# Patient Record
Sex: Female | Born: 1975 | Race: Black or African American | Hispanic: No | Smoking: Never smoker
Health system: Southern US, Community
[De-identification: ages and names within clinical notes are randomized; demographics above are authoritative.]

## PROBLEM LIST (undated history)

## (undated) ENCOUNTER — Emergency Department (HOSPITAL_COMMUNITY): Admission: EM | Payer: Self-pay

---

## 2014-08-03 ENCOUNTER — Emergency Department: Payer: Self-pay | Admitting: Emergency Medicine

## 2016-01-08 IMAGING — CR DG ANKLE COMPLETE 3+V*L*
1 series · 3 of 3 positions shown · non-contrast
Comparison: None.

CLINICAL DATA: Patient fell down 4 steps around 11 p.m. last night.
Medial left ankle pain.

EXAM:
LEFT ANKLE COMPLETE - 3+ VIEW

[Series 1: x ankle ap left · 0.14mm/px · 3 of 3 slices shown]
[im 1/3]
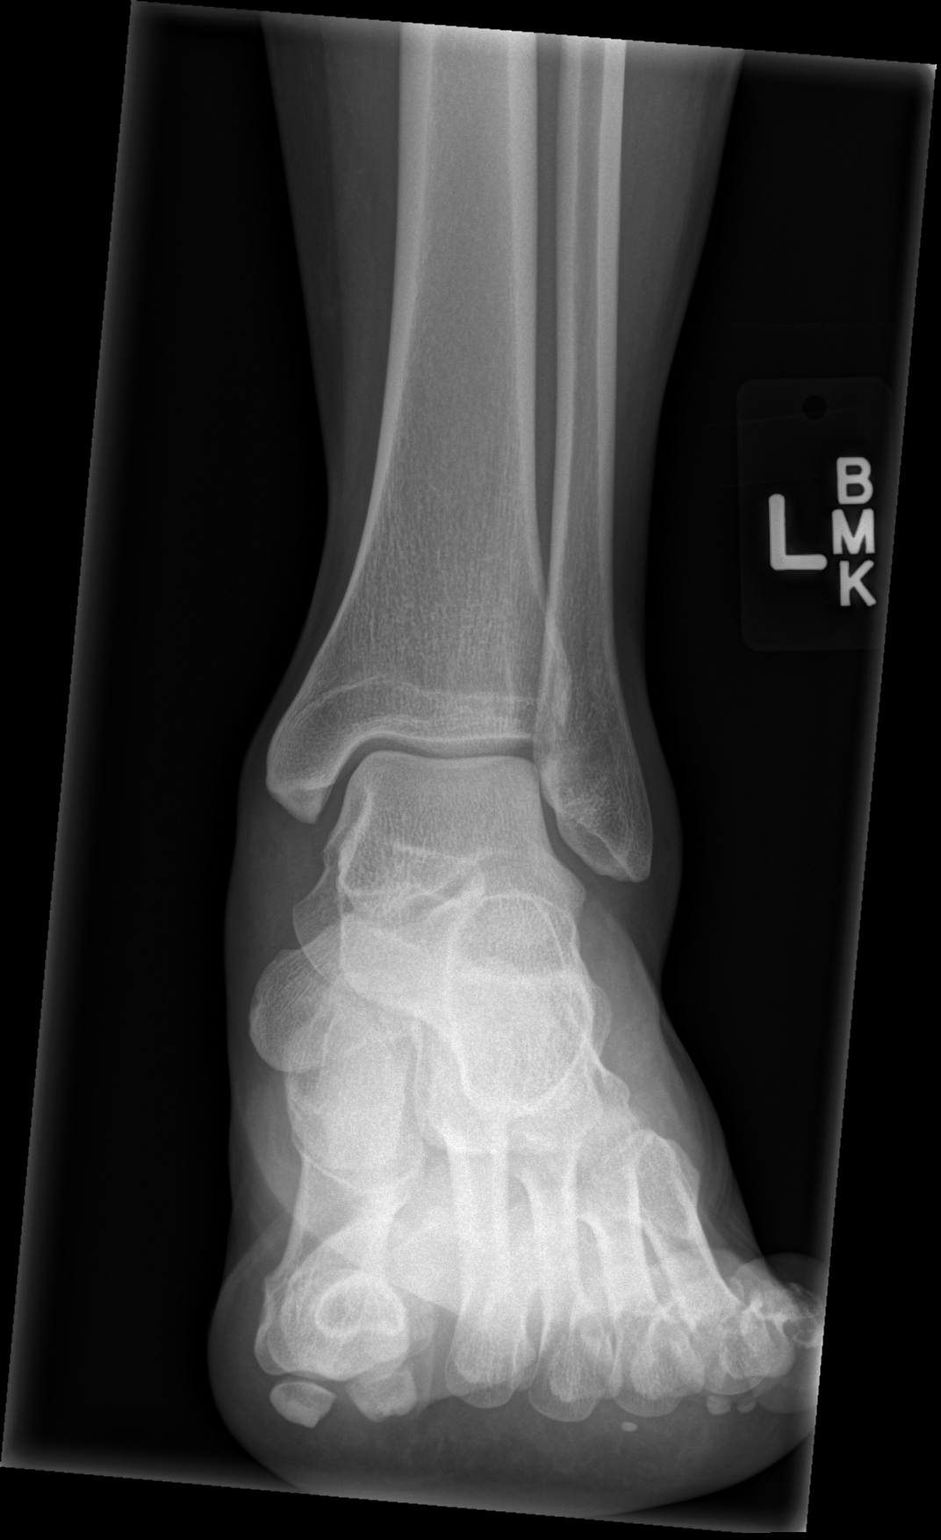
[im 2/3]
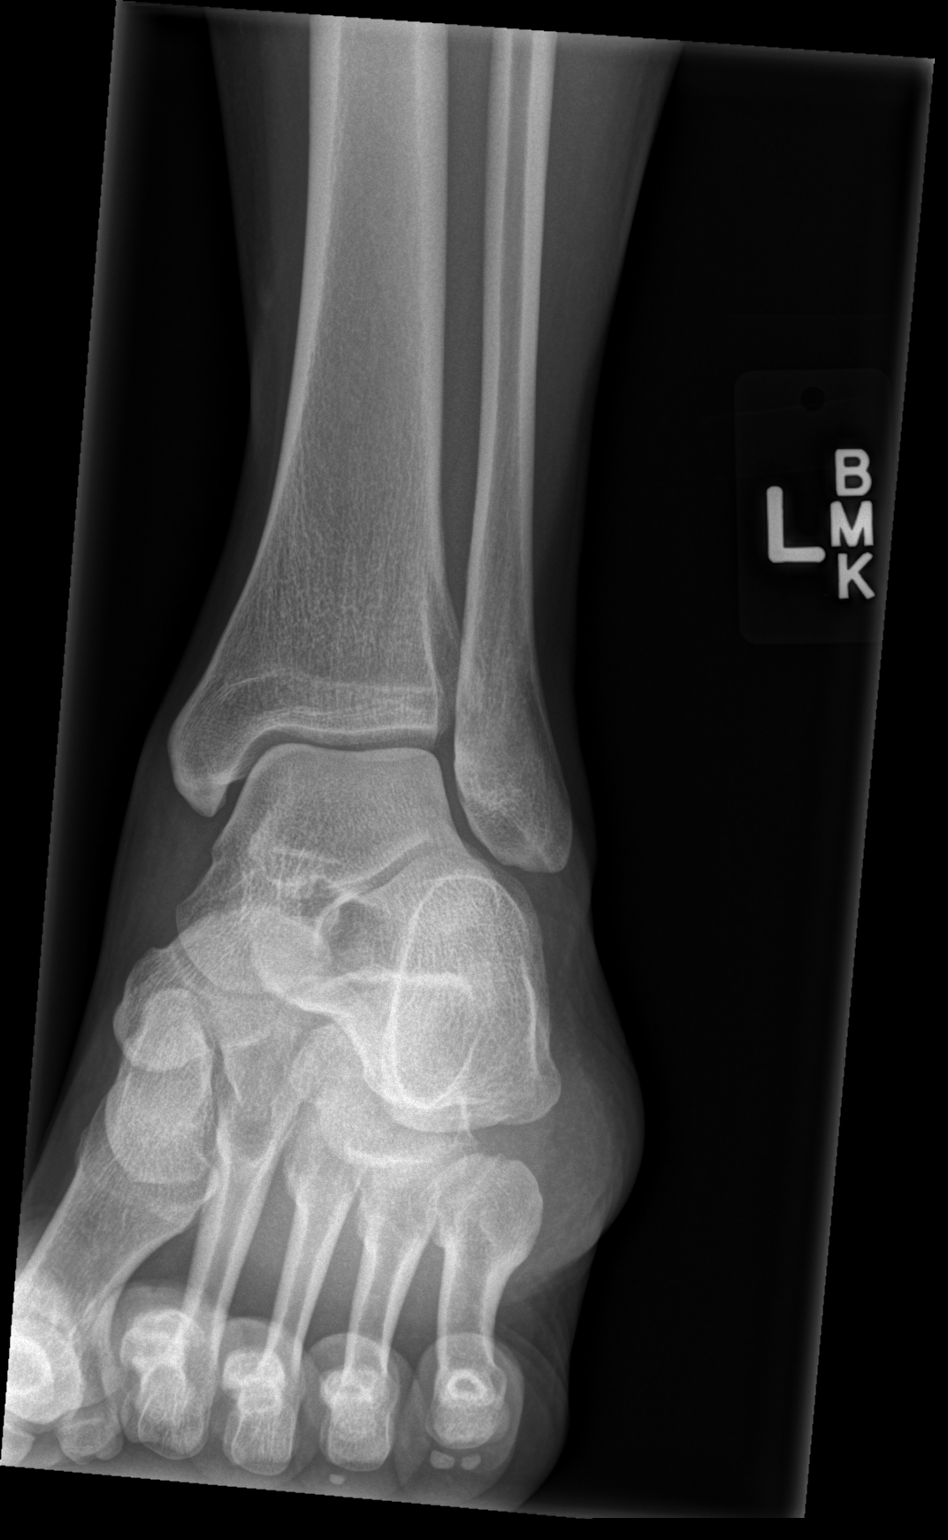
[im 3/3]
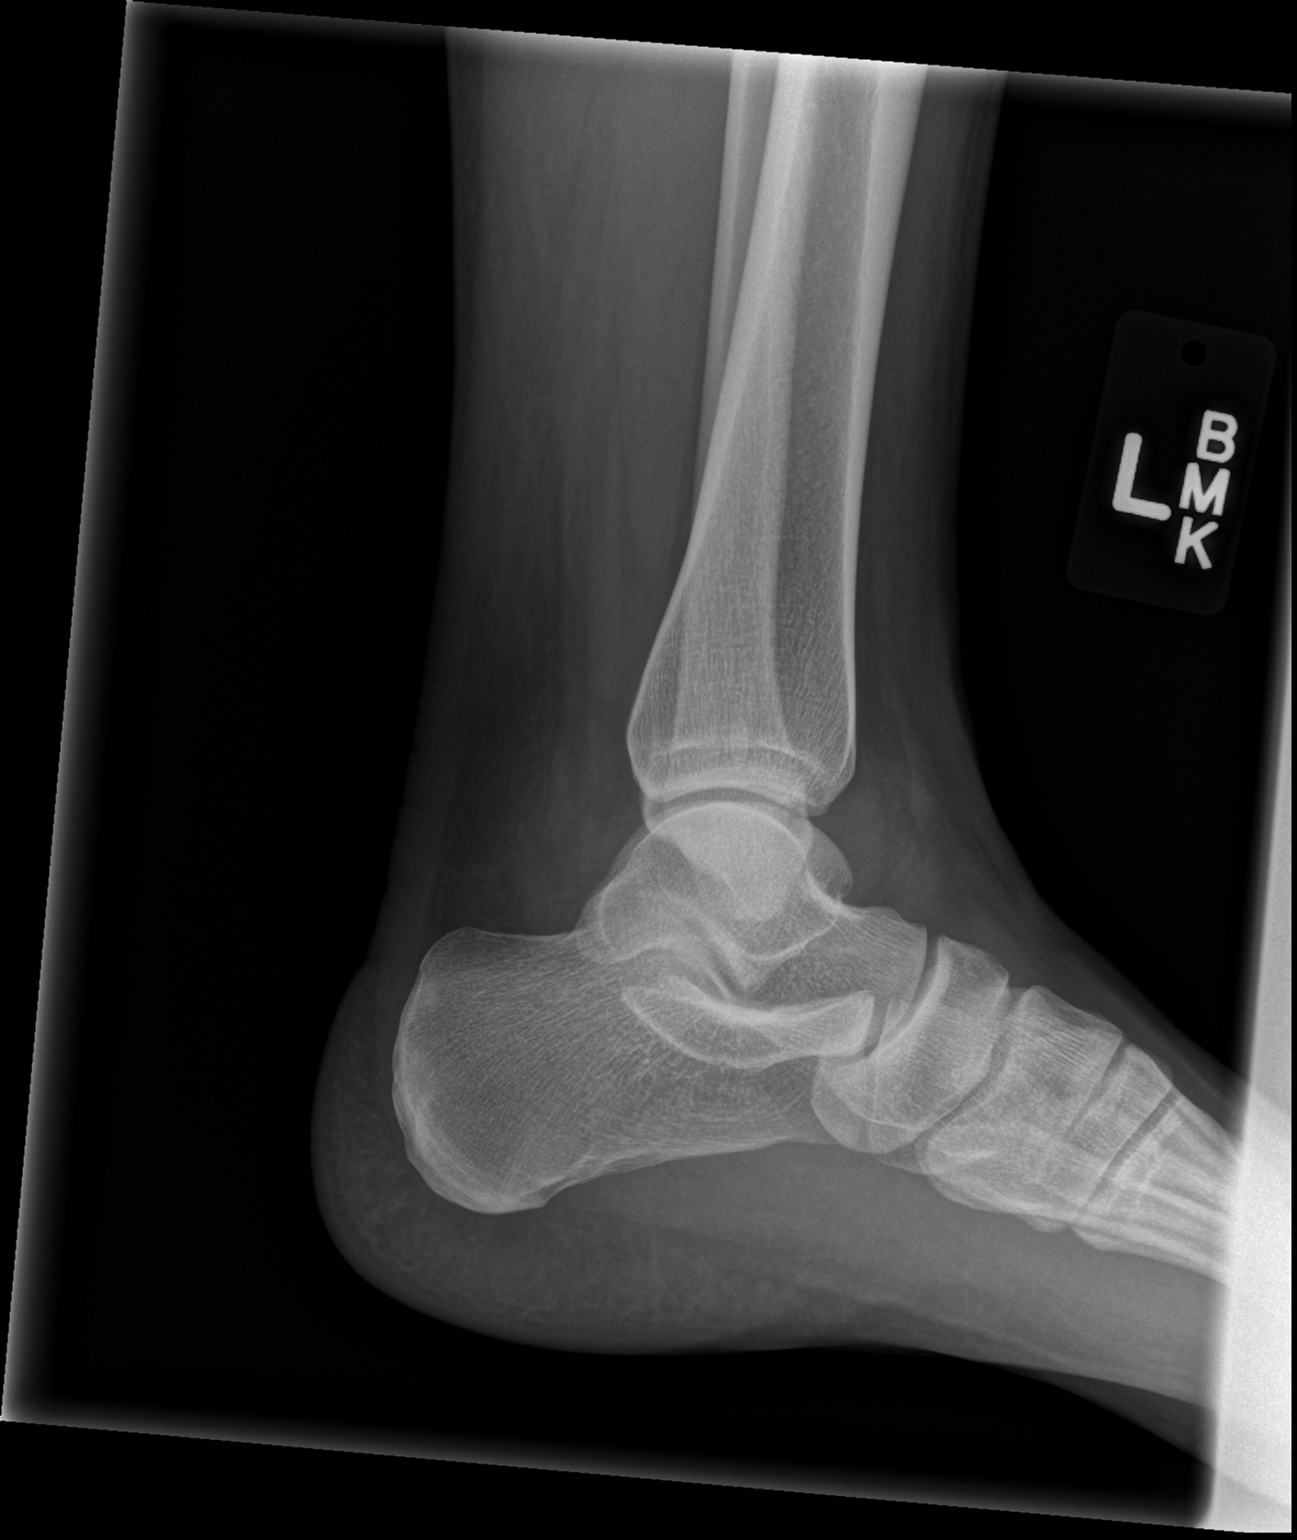

[3 of 3 positions shown; findings below may reference images not displayed]

FINDINGS: There is no evidence of fracture, dislocation, or joint effusion.
There is no evidence of arthropathy or other focal bone abnormality.
Soft tissues are unremarkable.
IMPRESSION: Negative.

## 2019-07-09 ENCOUNTER — Other Ambulatory Visit: Payer: Self-pay

## 2019-07-09 DIAGNOSIS — Z20822 Contact with and (suspected) exposure to covid-19: Secondary | ICD-10-CM

## 2019-07-10 LAB — NOVEL CORONAVIRUS, NAA: SARS-CoV-2, NAA: NOT DETECTED

## 2019-12-31 ENCOUNTER — Ambulatory Visit: Payer: Self-pay | Attending: Internal Medicine

## 2019-12-31 ENCOUNTER — Other Ambulatory Visit: Payer: Self-pay

## 2019-12-31 DIAGNOSIS — Z23 Encounter for immunization: Secondary | ICD-10-CM

## 2019-12-31 NOTE — Progress Notes (Signed)
   Covid-19 Vaccination Clinic  Name:  Sara Hughes    MRN: [id number] DOB: 01-21-1976  12/31/2019  [name] was observed post Covid-19 immunization for 15 minutes without incident. She was provided with Vaccine Information Sheet and instruction to access the V-Safe system.   [name] was instructed to call 911 with any severe reactions post vaccine: Marland Kitchen Difficulty breathing  . Swelling of face and throat  . A fast heartbeat  . A bad rash all over body  . Dizziness and weakness   Immunizations Administered    Name Date Dose VIS Date Route   Pfizer COVID-19 Vaccine 12/31/2019 12:27 PM 0.3 mL 09/07/2019 Intramuscular   Manufacturer: [employer], [employer]   Lot: [id number]   NDC: [address]-1

## 2020-01-23 ENCOUNTER — Ambulatory Visit: Payer: Self-pay

## 2020-01-24 ENCOUNTER — Ambulatory Visit: Payer: Self-pay | Attending: Internal Medicine

## 2020-01-24 DIAGNOSIS — Z23 Encounter for immunization: Secondary | ICD-10-CM

## 2020-01-24 NOTE — Progress Notes (Signed)
   Covid-19 Vaccination Clinic  Name:  Rayvin Abid    MRN: [id number] DOB: 04-10-1976  01/24/2020  Ms. Pruett was observed post Covid-19 immunization for 15 minutes without incident. She was provided with Vaccine Information Sheet and instruction to access the V-Safe system.   Ms. Sidman was instructed to call 911 with any severe reactions post vaccine: Marland Kitchen Difficulty breathing  . Swelling of face and throat  . A fast heartbeat  . A bad rash all over body  . Dizziness and weakness   Immunizations Administered    Name Date Dose VIS Date Route   Pfizer COVID-19 Vaccine 01/24/2020  9:37 AM 0.3 mL 11/21/2018 Intramuscular   Manufacturer: [employer], [employer]   Lot: [id number]   NDC: [address]-2

## 2020-05-27 ENCOUNTER — Ambulatory Visit (LOCAL_COMMUNITY_HEALTH_CENTER): Payer: Self-pay

## 2020-05-27 ENCOUNTER — Other Ambulatory Visit: Payer: Self-pay

## 2020-05-27 DIAGNOSIS — Z111 Encounter for screening for respiratory tuberculosis: Secondary | ICD-10-CM

## 2020-05-30 ENCOUNTER — Ambulatory Visit (LOCAL_COMMUNITY_HEALTH_CENTER): Payer: Self-pay

## 2020-05-30 ENCOUNTER — Other Ambulatory Visit: Payer: Self-pay

## 2020-05-30 DIAGNOSIS — Z111 Encounter for screening for respiratory tuberculosis: Secondary | ICD-10-CM

## 2020-05-30 LAB — TB SKIN TEST
Induration: 0 mm
TB Skin Test: NEGATIVE

## 2021-07-31 ENCOUNTER — Ambulatory Visit: Payer: BC Managed Care – PPO

## 2021-07-31 DIAGNOSIS — Z23 Encounter for immunization: Secondary | ICD-10-CM | POA: Diagnosis not present

## 2021-08-10 ENCOUNTER — Ambulatory Visit: Payer: BC Managed Care – PPO

## 2023-04-11 ENCOUNTER — Encounter: Payer: Self-pay | Admitting: Emergency Medicine

## 2023-04-11 ENCOUNTER — Other Ambulatory Visit: Payer: Self-pay

## 2023-04-11 ENCOUNTER — Emergency Department
Admission: EM | Admit: 2023-04-11 | Discharge: 2023-04-11 | Disposition: A | Discharge status: Home / Self Care | Payer: BC Managed Care – PPO | Attending: Emergency Medicine | Admitting: Emergency Medicine

## 2023-04-11 ENCOUNTER — Emergency Department: Payer: BC Managed Care – PPO

## 2023-04-11 DIAGNOSIS — R519 Headache, unspecified: Secondary | ICD-10-CM | POA: Diagnosis not present

## 2023-04-11 DIAGNOSIS — R079 Chest pain, unspecified: Secondary | ICD-10-CM

## 2023-04-11 DIAGNOSIS — R072 Precordial pain: Secondary | ICD-10-CM | POA: Diagnosis present

## 2023-04-11 LAB — BASIC METABOLIC PANEL
Anion gap: 8 (ref 5–15)
BUN: 12 mg/dL (ref 6–20)
CO2: 23 mmol/L (ref 22–32)
Calcium: 8.8 mg/dL — ABNORMAL LOW (ref 8.9–10.3)
Chloride: 104 mmol/L (ref 98–111)
Creatinine, Ser: 0.66 mg/dL (ref 0.44–1.00)
GFR, Estimated: >60 mL/min (ref >60)
Glucose, Bld: 97 mg/dL (ref 70–99)
Potassium: 3.4 mmol/L — ABNORMAL LOW (ref 3.5–5.1)
Sodium: 135 mmol/L (ref 135–145)

## 2023-04-11 LAB — CBC
HCT: 39 % (ref 36.0–46.0)
Hemoglobin: 12.5 g/dL (ref 12.0–15.0)
MCH: 27.7 pg (ref 26.0–34.0)
MCHC: 32.1 g/dL (ref 30.0–36.0)
MCV: 86.5 fL (ref 80.0–100.0)
Platelets: 214 10*3/uL (ref 150–400)
RBC: 4.51 MIL/uL (ref 3.87–5.11)
RDW: 13.9 % (ref 11.5–15.5)
WBC: 6.9 10*3/uL (ref 4.0–10.5)
nRBC: 0 % (ref 0.0–0.2)

## 2023-04-11 LAB — TROPONIN I (HIGH SENSITIVITY): Troponin I (High Sensitivity): 3 ng/L (ref <18)

## 2023-04-11 LAB — POC URINE PREG, ED: Preg Test, Ur: NEGATIVE

## 2023-04-11 NOTE — Discharge Instructions (Addendum)
Your evaluation in the emergency department did not show any signs of dangerous conditions like heart attack or stroke.  It is important to follow-up with a primary doctor-I made a referral to a primary doctor who will reach out to you to schedule an appointment to establish care.  For your migraine headaches, call Dr. Theora Master who is a neurologist to follow-up with an outpatient visit.  Take acetaminophen 650 mg and ibuprofen 400 mg every 6 hours for headache.  Take with food.   Your blood pressure was elevated in the emergency department today.  As we discussed, check your blood pressure once a day either in the morning or at night when you are calm and rested and record your daily blood pressures to be able to talk to your primary doctor about hypertension and starting medications as needed.  Thank you for choosing [place] for your health care today!  Please see your primary doctor this week for a follow up appointment.   If you have any new, worsening, or unexpected symptoms call your doctor right away or come back to the emergency department for reevaluation.  It was my pleasure to care for you today.   Daneil Dan Modesto Charon, MD

## 2023-04-11 NOTE — ED Triage Notes (Signed)
Pt also requesting CT of head. C/o frontal headaches x few weeks. Head associated with vision changes. Sts this is usual for her. Sts they're triggered when eatings certain foods. Denies headache at this time.

## 2023-04-11 NOTE — ED Provider Notes (Signed)
[facility] Provider Note    Event Date/Time   First MD Initiated Contact with Patient 04/11/23 2226     (approximate)   History   Chest Pain   HPI  Sara Hughes is a 47 y.o. female   Past medical history of migraine headaches, who presents to the emergency department with chest pain.  She says that her chest pain is substernal nonradiating and intermittent lasting seconds at a time over the last 2 months.  Nonexertional.  She has had some dyspnea associated.  She has had no respiratory infectious symptoms.  She denies history of blood clots, hormone use, immobilization.  No unilateral leg symptoms.  She is here in the emergency department because the episode of substernal chest pain is lasting longer today.  Regarding her migraine headaches she said that she has had these longstanding and used to see a neurologist but no longer sees, triggered by certain smells or foods.  She is a mild headache now and is declining medications.  Independent Historian contributed to assessment above: She is here with her husband who corroborates information given above      Physical Exam   Triage Vital Signs: ED Triage Vitals  Encounter Vitals Group     BP 04/11/23 2031 (!) 177/99     Systolic BP Percentile --      Diastolic BP Percentile --      Pulse Rate 04/11/23 2031 80     Resp 04/11/23 2031 17     Temp 04/11/23 2031 98.8 F (37.1 C)     Temp Source 04/11/23 2031 Oral     SpO2 04/11/23 2031 100 %     Weight 04/11/23 2032 166 lb (75.3 kg)     Height 04/11/23 2032 5\' 2"  (1.575 m)     Head Circumference --      Peak Flow --      Pain Score 04/11/23 2031 8     Pain Loc --      Pain Education --      Exclude from Growth Chart --     Most recent vital signs: Vitals:   04/11/23 2031  BP: (!) 177/99  Pulse: 80  Resp: 17  Temp: 98.8 F (37.1 C)  SpO2: 100%    General: Awake, no distress.  CV:  Good peripheral perfusion.  Resp:  Normal effort.   Abd:  No distention.  Other:  Comfortable appearing, hypertensive otherwise vital signs normal.  No respiratory distress clear lungs without focality or wheezing.  Soft nontender abdomen.  Radial pulses intact equal bilaterally.  Appears euvolemic no unilateral leg swelling or pain.  No signs of head trauma, neck is supple with full range of motion.  She has no focal neurologic deficits including dysarthria, facial asymmetry, gait is normal, no motor or sensory deficits.   ED Results / Procedures / Treatments   Labs (all labs ordered are listed, but only abnormal results are displayed) Labs Reviewed  BASIC METABOLIC PANEL - Abnormal; Notable for the following components:      Result Value   Potassium 3.4 (*)    Calcium 8.8 (*)    All other components within normal limits  CBC  URINALYSIS, ROUTINE W REFLEX MICROSCOPIC  POC URINE PREG, ED  TROPONIN I (HIGH SENSITIVITY)  TROPONIN I (HIGH SENSITIVITY)     I ordered and reviewed the above labs they are notable for troponin is negative.  EKG  ED ECG REPORT I, Pilar Jarvis, the attending physician,  personally viewed and interpreted this ECG.   Date: 04/11/2023  EKG Time: 2037  Rate: 70  Rhythm: nsr  Axis: nl  Intervals:none  ST&T Change: no stemi    RADIOLOGY I independently reviewed and interpreted chest x-ray see no obvious focalities or pneumothorax   PROCEDURES:  Critical Care performed: No  Procedures   MEDICATIONS ORDERED IN ED: Medications - No data to display   IMPRESSION / MDM / ASSESSMENT AND PLAN / ED COURSE  I reviewed the triage vital signs and the nursing notes.                                Patient's presentation is most consistent with acute presentation with potential threat to life or bodily function.  Differential diagnosis includes, but is not limited to, ACS, dissection, PE, migraine headache, CVA or ICH   The patient is on the cardiac monitor to evaluate for evidence of arrhythmia  and/or significant heart rate changes.  MDM:    Intermittent nonexertional nonradiating chest pain would be very atypical for ACS, dissection, PE -I doubt cardiopulmonary emergency in this young otherwise healthy patient.  ED is nonischemic and her initial troponin is negative, defer second troponin given chronicity of symptoms and low cardiac risk factors, low clinical suspicion for cardiac ischemia.  Considered PE given her associated dyspnea with chest pain but that this is less likely, Wells low risk and PERC negative.  Symptoms not consistent with dissection.  In terms of her headaches, history of migraine headaches and symptoms are mild at this time and she is declining medications.  I doubt ICH or stroke given no history of trauma, and no focal neurologic deficits.  She initially was requesting CT scan " of my whole body" but I explained to her the risks and benefits of exposure to radiation that is unlikely to find actionable findings and my suggestion to defer advanced imaging at this time, and she understands and agrees.  Her blood pressure is high.  I advised that she monitor her blood pressure and keep a log at home and discussed with PMD for management as needed.    I considered hospitalization for admission or observation however given her stability overall well appearance and unremarkable workup as above and low clinical suspicion for neurologic emergencies or cardiopulmonary emergencies, plan will be for outpatient follow-up.  She is new to this area and has not establish care with a primary doctor so I gave her referral to primary doctor.  I also have her follow-up with Dr. Malvin Johns given her history of migraines no longer seeing neurologist and worsening headaches for further evaluation and management.  She understands return the emergency department for any new or worsening symptoms.   --  She gave a urine sample to triage, which was sent to the lab.  She has no urinary symptoms  like dysuria or frequency but instead wants her urine checked for glucose.  She does not need to stay for the result, I ordered a UA and she can follow-up with PMD for results as needed.        FINAL CLINICAL IMPRESSION(S) / ED DIAGNOSES   Final diagnoses:  Nonspecific chest pain  Nonintractable headache, unspecified chronicity pattern, unspecified headache type     Rx / DC Orders   ED Discharge Orders          Ordered    Ambulatory Referral to Primary Care (Establish Care)  04/11/23 2248             Note:  This document was prepared using Dragon voice recognition software and may include unintentional dictation errors.    Pilar Jarvis, MD 04/11/23 2300

## 2023-04-11 NOTE — ED Triage Notes (Signed)
Pt c/o sudden onset of centralized chest pain while at rest around 6:30 pm. Pain persistent. Pain associated with SOB, worsening with ambulation. Denies HX HTN BP 177/99 in triage. Denies cardiac hx.

## 2023-04-11 NOTE — ED Notes (Signed)
Urine specimen cup provided to pt for pending urine sample

## 2023-04-12 LAB — URINALYSIS, ROUTINE W REFLEX MICROSCOPIC
Bacteria, UA: NONE SEEN
Bilirubin Urine: NEGATIVE
Glucose, UA: NEGATIVE mg/dL
Ketones, ur: NEGATIVE mg/dL
Leukocytes,Ua: NEGATIVE
Nitrite: NEGATIVE
Protein, ur: NEGATIVE mg/dL
Specific Gravity, Urine: 1.009 (ref 1.005–1.030)
Squamous Epithelial / HPF: NONE SEEN /HPF (ref 0–5)
pH: 6 (ref 5.0–8.0)

## 2023-05-18 ENCOUNTER — Other Ambulatory Visit: Payer: Self-pay | Admitting: Neurology

## 2023-05-18 DIAGNOSIS — R519 Headache, unspecified: Secondary | ICD-10-CM

## 2023-05-31 ENCOUNTER — Encounter: Payer: Self-pay | Admitting: Neurology

## 2023-06-08 ENCOUNTER — Ambulatory Visit
Admission: RE | Admit: 2023-06-08 | Discharge: 2023-06-08 | Disposition: A | Discharge status: Home / Self Care | Payer: BC Managed Care – PPO | Source: Ambulatory Visit | Attending: Neurology | Admitting: Neurology

## 2023-06-08 DIAGNOSIS — R519 Headache, unspecified: Secondary | ICD-10-CM

## 2023-06-08 MED ORDER — GADOPICLENOL 0.5 MMOL/ML IV SOLN
7.5000 mL | Freq: Once | INTRAVENOUS | Status: AC | PRN
  Administered 2023-06-08: 7.5 mL via INTRAVENOUS

## 2023-06-09 ENCOUNTER — Encounter: Payer: Self-pay | Admitting: Nurse Practitioner

## 2023-06-09 ENCOUNTER — Ambulatory Visit: Payer: BC Managed Care – PPO | Admitting: Nurse Practitioner

## 2023-06-09 VITALS — BP 140/80 | HR 61 | Temp 98.5°F | Ht 62.0 in | Wt 164.4 lb

## 2023-06-09 DIAGNOSIS — Z1329 Encounter for screening for other suspected endocrine disorder: Secondary | ICD-10-CM

## 2023-06-09 DIAGNOSIS — Z1159 Encounter for screening for other viral diseases: Secondary | ICD-10-CM

## 2023-06-09 DIAGNOSIS — Z0000 Encounter for general adult medical examination without abnormal findings: Secondary | ICD-10-CM | POA: Diagnosis not present

## 2023-06-09 DIAGNOSIS — Z114 Encounter for screening for human immunodeficiency virus [HIV]: Secondary | ICD-10-CM

## 2023-06-09 DIAGNOSIS — Z23 Encounter for immunization: Secondary | ICD-10-CM

## 2023-06-09 DIAGNOSIS — Z8632 Personal history of gestational diabetes: Secondary | ICD-10-CM | POA: Diagnosis not present

## 2023-06-09 DIAGNOSIS — Z13220 Encounter for screening for lipoid disorders: Secondary | ICD-10-CM | POA: Diagnosis not present

## 2023-06-09 DIAGNOSIS — R030 Elevated blood-pressure reading, without diagnosis of hypertension: Secondary | ICD-10-CM | POA: Insufficient documentation

## 2023-06-09 DIAGNOSIS — Z1211 Encounter for screening for malignant neoplasm of colon: Secondary | ICD-10-CM

## 2023-06-09 LAB — LIPID PANEL
Cholesterol: 112 mg/dL (ref 0–200)
HDL: 45.3 mg/dL (ref >39.00)
LDL Cholesterol: 54 mg/dL (ref 0–99)
NonHDL: 66.63
Total CHOL/HDL Ratio: 2
Triglycerides: 64 mg/dL (ref 0.0–149.0)
VLDL: 12.8 mg/dL (ref 0.0–40.0)

## 2023-06-09 LAB — COMPREHENSIVE METABOLIC PANEL
ALT: 9 U/L (ref 0–35)
AST: 14 U/L (ref 0–37)
Albumin: 4 g/dL (ref 3.5–5.2)
Alkaline Phosphatase: 53 U/L (ref 39–117)
BUN: 14 mg/dL (ref 6–23)
CO2: 29 meq/L (ref 19–32)
Calcium: 9 mg/dL (ref 8.4–10.5)
Chloride: 105 meq/L (ref 96–112)
Creatinine, Ser: 0.72 mg/dL (ref 0.40–1.20)
GFR: 99.44 mL/min (ref >60.00)
Glucose, Bld: 86 mg/dL (ref 70–99)
Potassium: 3.9 meq/L (ref 3.5–5.1)
Sodium: 141 meq/L (ref 135–145)
Total Bilirubin: 0.8 mg/dL (ref 0.2–1.2)
Total Protein: 6.8 g/dL (ref 6.0–8.3)

## 2023-06-09 LAB — CBC WITH DIFFERENTIAL/PLATELET
Basophils Absolute: 0 10*3/uL (ref 0.0–0.1)
Basophils Relative: 0.7 % (ref 0.0–3.0)
Eosinophils Absolute: 0.1 10*3/uL (ref 0.0–0.7)
Eosinophils Relative: 1.3 % (ref 0.0–5.0)
HCT: 38.2 % (ref 36.0–46.0)
Hemoglobin: 12.3 g/dL (ref 12.0–15.0)
Lymphocytes Relative: 39.3 % (ref 12.0–46.0)
Lymphs Abs: 2 10*3/uL (ref 0.7–4.0)
MCHC: 32.2 g/dL (ref 30.0–36.0)
MCV: 85.3 fl (ref 78.0–100.0)
Monocytes Absolute: 0.2 10*3/uL (ref 0.1–1.0)
Monocytes Relative: 4.9 % (ref 3.0–12.0)
Neutro Abs: 2.7 10*3/uL (ref 1.4–7.7)
Neutrophils Relative %: 53.8 % (ref 43.0–77.0)
Platelets: 244 10*3/uL (ref 150.0–400.0)
RBC: 4.48 Mil/uL (ref 3.87–5.11)
RDW: 14.8 % (ref 11.5–15.5)
WBC: 5 10*3/uL (ref 4.0–10.5)

## 2023-06-09 LAB — HEMOGLOBIN A1C: Hgb A1c MFr Bld: 5.7 % (ref 4.6–6.5)

## 2023-06-09 LAB — VITAMIN D 25 HYDROXY (VIT D DEFICIENCY, FRACTURES): VITD: 18.65 ng/mL — ABNORMAL LOW (ref 30.00–100.00)

## 2023-06-09 LAB — TSH: TSH: 0.23 u[IU]/mL — ABNORMAL LOW (ref 0.35–5.50)

## 2023-06-09 NOTE — Assessment & Plan Note (Signed)
Physical exam complete. Lab work as outlined. Will contact patient with results. Pap- due, she will follow up with her Ob-Gyn. Colonoscopy- due, patient agreeable to Cologuard. Order placed, encouraged patient to complete as soon as it arrives. Mammogram- due in October, she is scheduled with a mobile mammogram program to complete this. Flu vaccine not due. Tetanus vaccine- given today in office. Declined additional COVID vaccines. HIV and Hep C screenings today. Recommended follow ups with Dentist and Ophthalmology for annual exams. Encouraged healthy diet and exercise. Return to care in one month, sooner PRN.

## 2023-06-09 NOTE — Progress Notes (Signed)
Bethanie Dicker, NP-C Phone: [phone]  Sara Hughes is a 47 y.o. female who presents today to establish care and for annual exam. She has no complaints or new concerns today. She has no significant past medical history. She is not on any prescription medications.   Diet: Well balanced- likes chips, eats out half the time  Exercise: Walking Pap smear: 06/23/2020- Due- sees Ob-Gyn Colonoscopy: Never- Due! Mammogram: October 2023- Due in October- uses mobile mammogram program, has appt Family history-  Colon cancer: No  Breast cancer: No  Ovarian cancer: No Menses: LMP- 06/01/2023- regular cycles, denies problems with heavy bleeding Sexually active: Yes Vaccines-   Flu: Not due  Tetanus: Unsure, believes it has been at least 10 years- will give today  COVID19: x 2 HIV screening: Today Hep C Screening: Today Tobacco use: No Alcohol use: No Illicit Drug use: No Dentist: Yes Ophthalmology: Yes   Active Ambulatory Problems    Diagnosis Date Noted   Preventative health care 06/09/2023   Hx gestational diabetes 06/09/2023   Elevated blood pressure reading in office without diagnosis of hypertension 06/09/2023   Resolved Ambulatory Problems    Diagnosis Date Noted   No Resolved Ambulatory Problems   No Additional Past Medical History    Family History  Problem Relation Age of Onset   Stroke Mother    Stroke Father    Hypertension Father    Diabetes Father    Arthritis Father    Hypertension Sister     Social History   Socioeconomic History   Marital status: Unknown    Spouse name: Not on file   Number of children: Not on file   Years of education: Not on file   Highest education level: Not on file  Occupational History   Not on file  Tobacco Use   Smoking status: Never   Smokeless tobacco: Never  Substance and Sexual Activity   Alcohol use: Never   Drug use: Never   Sexual activity: Yes  Other Topics Concern   Not on file  Social History Narrative   Not  on file   Social Determinants of Health   Financial Resource Strain: Not on file  Food Insecurity: Not on file  Transportation Needs: Not on file  Physical Activity: Not on file  Stress: Not on file  Social Connections: Not on file  Intimate Partner Violence: Not on file    ROS  General:  Negative for unexplained weight loss, fever Skin: Negative for new or changing mole, sore that won't heal HEENT: Negative for trouble hearing, trouble seeing, ringing in ears, mouth sores, hoarseness, change in voice, dysphagia. CV:  Negative for chest pain, dyspnea, edema, palpitations Resp: Negative for cough, dyspnea, hemoptysis GI: Negative for nausea, vomiting, diarrhea, constipation, abdominal pain, melena, hematochezia. GU: Negative for dysuria, incontinence, urinary hesitance, hematuria, vaginal or penile discharge, polyuria, sexual difficulty, lumps in testicle or breasts MSK: Negative for muscle cramps or aches, joint pain or swelling Neuro: Negative for headaches, weakness, numbness, dizziness, passing out/fainting Psych: Negative for depression, anxiety, memory problems  Objective  Physical Exam Vitals:   06/09/23 1005 06/09/23 1036  BP: (!) 150/100 (!) 140/80  Pulse: 61   Temp: 98.5 F (36.9 C)   SpO2: 96%     BP Readings from Last 3 Encounters:  06/09/23 (!) 140/80  04/11/23 (!) 169/89   Wt Readings from Last 3 Encounters:  06/09/23 164 lb 6.4 oz (74.6 kg)  04/11/23 166 lb (75.3 kg)    Physical  Exam Constitutional:      General: She is not in acute distress.    Appearance: Normal appearance.  HENT:     Head: Normocephalic.     Right Ear: Tympanic membrane normal.     Left Ear: Tympanic membrane normal.     Nose: Nose normal.     Mouth/Throat:     Mouth: Mucous membranes are moist.     Pharynx: Oropharynx is clear.  Eyes:     Conjunctiva/sclera: Conjunctivae normal.     Pupils: Pupils are equal, round, and reactive to light.  Neck:     Thyroid: No  thyromegaly.  Cardiovascular:     Rate and Rhythm: Normal rate and regular rhythm.     Heart sounds: Normal heart sounds.  Pulmonary:     Effort: Pulmonary effort is normal.     Breath sounds: Normal breath sounds.  Abdominal:     General: Abdomen is flat. Bowel sounds are normal.     Palpations: Abdomen is soft. There is no mass.     Tenderness: There is no abdominal tenderness.  Musculoskeletal:        General: Normal range of motion.  Lymphadenopathy:     Cervical: No cervical adenopathy.  Skin:    General: Skin is warm and dry.     Findings: No rash.  Neurological:     General: No focal deficit present.     Mental Status: She is alert.  Psychiatric:        Mood and Affect: Mood normal.        Behavior: Behavior normal.    Assessment/Plan:   Preventative health care Assessment & Plan: Physical exam complete. Lab work as outlined. Will contact patient with results. Pap- due, she will follow up with her Ob-Gyn. Colonoscopy- due, patient agreeable to Cologuard. Order placed, encouraged patient to complete as soon as it arrives. Mammogram- due in October, she is scheduled with a mobile mammogram program to complete this. Flu vaccine not due. Tetanus vaccine- given today in office. Declined additional COVID vaccines. HIV and Hep C screenings today. Recommended follow ups with Dentist and Ophthalmology for annual exams. Encouraged healthy diet and exercise. Return to care in one month, sooner PRN.   Orders: -     CBC with Differential/Platelet -     Comprehensive metabolic panel -     VITAMIN D 25 Hydroxy (Vit-D Deficiency, Fractures)  Elevated blood pressure reading in office without diagnosis of hypertension Assessment & Plan: Elevated reading x 2 today in office. She will start checking her blood pressure daily at home and keep a log to bring to her next appointment. BP log provided to patient. Counseled on health risks of uncontrolled hypertension. Advised decreasing sodium  intake. Information on preventing hypertension and DASH diet provided to patient. She will follow up in 4 weeks for blood pressure check and to review her home readings, if remaining consistently elevated will start on medication at that time. Will continue to monitor.    Hx gestational diabetes -     Hemoglobin A1c  Thyroid disorder screen -     TSH  Lipid screening -     Lipid panel  Encounter for screening for HIV -     HIV Antibody (routine testing w rflx)  Encounter for hepatitis C screening test for low risk patient -     Hepatitis C antibody  Screen for colon cancer -     Cologuard  Need for Tdap vaccination -  Tdap vaccine greater than or equal to 7yo IM    Return in about 4 weeks (around 07/07/2023) for Follow up.   Bethanie Dicker, NP-C [facility] - [employer]

## 2023-06-09 NOTE — Assessment & Plan Note (Addendum)
Elevated reading x 2 today in office. She will start checking her blood pressure daily at home and keep a log to bring to her next appointment. BP log provided to patient. Counseled on health risks of uncontrolled hypertension. Advised decreasing sodium intake. Information on preventing hypertension and DASH diet provided to patient. She will follow up in 4 weeks for blood pressure check and to review her home readings, if remaining consistently elevated will start on medication at that time. Will continue to monitor.

## 2023-06-10 ENCOUNTER — Telehealth: Payer: Self-pay

## 2023-06-10 ENCOUNTER — Other Ambulatory Visit: Payer: Self-pay | Admitting: Nurse Practitioner

## 2023-06-10 DIAGNOSIS — R7989 Other specified abnormal findings of blood chemistry: Secondary | ICD-10-CM

## 2023-06-10 DIAGNOSIS — E559 Vitamin D deficiency, unspecified: Secondary | ICD-10-CM

## 2023-06-10 LAB — HIV ANTIBODY (ROUTINE TESTING W REFLEX): HIV 1&2 Ab, 4th Generation: NONREACTIVE

## 2023-06-10 LAB — HEPATITIS C ANTIBODY: Hepatitis C Ab: NONREACTIVE

## 2023-06-10 MED ORDER — VITAMIN D (ERGOCALCIFEROL) 1.25 MG (50000 UNIT) PO CAPS
50000.0000 [IU] | ORAL_CAPSULE | ORAL | 1 refills | Status: AC
Start: 2023-06-10 — End: ?

## 2023-06-10 NOTE — Telephone Encounter (Addendum)
Called and left patient a voice message asking to give the office a call back for recent lab results. Will try calling patient again.  ----- Message from [facility] sent at 06/10/2023 12:28 PM EDT ----- TSH is slightly low, I would like to keep an eye on this and recheck in it about 3 months to monitor. Your vitamin D level is low, I am going to send in a once a week dose for you to start taking. We will plan to recheck this in the future. The rest of your labs are stable. Please let me know if you have any questions. We will call you to schedule a lab appointment in 3 months.   She needs a 3 month lab appointment please.

## 2023-06-13 NOTE — Telephone Encounter (Signed)
2nd attempt and 2nd VM left in regards to labs and scheduling

## 2023-06-14 NOTE — Telephone Encounter (Signed)
3rd attempt at trying to get in contact pt a detailed VM has been left informing pt of mychart msg provider has sent. A letter is also being mailed to pt, pt will need to be scheduled a 3 month lab appt which will be mentioned in the letter for the pt.

## 2023-06-24 LAB — COLOGUARD: COLOGUARD: NEGATIVE

## 2023-07-07 ENCOUNTER — Encounter: Payer: Self-pay | Admitting: Nurse Practitioner

## 2023-07-07 ENCOUNTER — Ambulatory Visit: Payer: BC Managed Care – PPO | Admitting: Nurse Practitioner

## 2023-07-07 VITALS — BP 150/90 | Temp 98.3°F | Ht 62.0 in | Wt 165.4 lb

## 2023-07-07 DIAGNOSIS — I10 Essential (primary) hypertension: Secondary | ICD-10-CM | POA: Diagnosis not present

## 2023-07-07 MED ORDER — AMLODIPINE BESYLATE 2.5 MG PO TABS
2.5000 mg | ORAL_TABLET | Freq: Every day | ORAL | 3 refills | Status: AC
Start: 2023-07-07 — End: ?

## 2023-07-07 NOTE — Assessment & Plan Note (Signed)
BP elevated today in office x 2. Home readings reviewed and staying consistently elevated. Will start patient on Norvasc 2.5 mg daily. She will continue to check her blood pressure daily at home. She will return in 2 weeks for a BP check with nursing and BMP. Advised to decrease sodium intake. Will continue to monitor.

## 2023-07-07 NOTE — Progress Notes (Signed)
  Bethanie Dicker, NP-C Phone: [phone]  Sara Hughes is a 47 y.o. female who presents today for follow up.   She has been checking her blood pressure daily at home with consistently elevated readings over 130/80. She is agreeable to starting on blood pressure medication today.   Elevated Blood Pressure Disease Monitoring Home BP Monitoring- 130-140/80-90  Chest pain- No    Dyspnea- No Medications Compliance-  None. Lightheadedness-  No  Edema- No BMET    Component Value Date/Time   NA 141 06/09/2023 1041   K 3.9 06/09/2023 1041   CL 105 06/09/2023 1041   CO2 29 06/09/2023 1041   GLUCOSE 86 06/09/2023 1041   BUN 14 06/09/2023 1041   CREATININE 0.72 06/09/2023 1041   CALCIUM 9.0 06/09/2023 1041   GFRNONAA >60 04/11/2023 2034    Social History   Tobacco Use  Smoking Status Never  Smokeless Tobacco Never    Current Outpatient Medications on File Prior to Visit  Medication Sig Dispense Refill   Vitamin D, Ergocalciferol, (DRISDOL) 1.25 MG (50000 UNIT) CAPS capsule Take 1 capsule (50,000 Units total) by mouth every 7 (seven) days. 13 capsule 1   No current facility-administered medications on file prior to visit.    ROS see history of present illness  Objective  Physical Exam Vitals:   07/07/23 0828 07/07/23 0842  BP: (!) 140/86 (!) 150/90  Temp: 98.3 F (36.8 C)     BP Readings from Last 3 Encounters:  07/07/23 (!) 150/90  06/09/23 (!) 140/80  04/11/23 (!) 169/89   Wt Readings from Last 3 Encounters:  07/07/23 165 lb 6.4 oz (75 kg)  06/09/23 164 lb 6.4 oz (74.6 kg)  04/11/23 166 lb (75.3 kg)    Physical Exam Constitutional:      General: She is not in acute distress.    Appearance: Normal appearance.  HENT:     Head: Normocephalic.  Cardiovascular:     Rate and Rhythm: Normal rate and regular rhythm.     Heart sounds: Normal heart sounds.  Pulmonary:     Effort: Pulmonary effort is normal.     Breath sounds: Normal breath sounds.  Skin:     General: Skin is warm and dry.  Neurological:     General: No focal deficit present.     Mental Status: She is alert.  Psychiatric:        Mood and Affect: Mood normal.        Behavior: Behavior normal.    Assessment/Plan: Please see individual problem list.  Primary hypertension Assessment & Plan: BP elevated today in office x 2. Home readings reviewed and staying consistently elevated. Will start patient on Norvasc 2.5 mg daily. She will continue to check her blood pressure daily at home. She will return in 2 weeks for a BP check with nursing and BMP. Advised to decrease sodium intake. Will continue to monitor.   Orders: -     amLODIPine Besylate; Take 1 tablet (2.5 mg total) by mouth daily.  Dispense: 90 tablet; Refill: 3 -     Basic metabolic panel; Future   Return in about 6 months (around 01/05/2024) for Follow up.   Bethanie Dicker, NP-C [facility] - [employer]

## 2023-07-19 ENCOUNTER — Encounter: Payer: Self-pay | Admitting: Nurse Practitioner

## 2023-07-21 ENCOUNTER — Ambulatory Visit (INDEPENDENT_AMBULATORY_CARE_PROVIDER_SITE_OTHER): Payer: BC Managed Care – PPO

## 2023-07-21 DIAGNOSIS — R7989 Other specified abnormal findings of blood chemistry: Secondary | ICD-10-CM

## 2023-07-21 DIAGNOSIS — I10 Essential (primary) hypertension: Secondary | ICD-10-CM | POA: Diagnosis not present

## 2023-07-21 NOTE — Progress Notes (Signed)
Pt came into clinic today for a blood pressure check. Upon checking pt sat in the exam room at rest for 5 mins. Blood pressure was 136/78. Pt denied any symptoms.  Pt mentioned she had a rash on her right arm. Review mychart msgs with pt. Pt verbalized understanding and stated that she would continue to monitor.

## 2023-07-22 LAB — BASIC METABOLIC PANEL
BUN: 11 mg/dL (ref 6–23)
CO2: 26 meq/L (ref 19–32)
Calcium: 8.8 mg/dL (ref 8.4–10.5)
Chloride: 106 meq/L (ref 96–112)
Creatinine, Ser: 0.77 mg/dL (ref 0.40–1.20)
GFR: 91.67 mL/min (ref >60.00)
Glucose, Bld: 91 mg/dL (ref 70–99)
Potassium: 3.6 meq/L (ref 3.5–5.1)
Sodium: 140 meq/L (ref 135–145)

## 2023-07-22 LAB — TSH: TSH: 0.69 u[IU]/mL (ref 0.35–5.50)

## 2023-09-07 ENCOUNTER — Telehealth: Payer: Self-pay | Admitting: Nurse Practitioner

## 2023-09-07 NOTE — Telephone Encounter (Signed)
Patient need lab orders.

## 2023-09-12 ENCOUNTER — Other Ambulatory Visit: Payer: Self-pay | Admitting: Nurse Practitioner

## 2023-09-12 DIAGNOSIS — E559 Vitamin D deficiency, unspecified: Secondary | ICD-10-CM | POA: Insufficient documentation

## 2023-09-12 DIAGNOSIS — R7989 Other specified abnormal findings of blood chemistry: Secondary | ICD-10-CM

## 2023-09-14 ENCOUNTER — Other Ambulatory Visit: Payer: BC Managed Care – PPO

## 2023-09-15 ENCOUNTER — Other Ambulatory Visit (INDEPENDENT_AMBULATORY_CARE_PROVIDER_SITE_OTHER): Payer: BC Managed Care – PPO

## 2023-09-15 DIAGNOSIS — R7989 Other specified abnormal findings of blood chemistry: Secondary | ICD-10-CM

## 2023-09-15 DIAGNOSIS — E559 Vitamin D deficiency, unspecified: Secondary | ICD-10-CM

## 2023-09-16 LAB — TSH+T4F+T3FREE+THYABS+TPO+VD25
Free T4: 1.03 ng/dL (ref 0.82–1.77)
T3, Free: 3 pg/mL (ref 2.0–4.4)
TSH: 0.99 u[IU]/mL (ref 0.450–4.500)
Thyroglobulin Antibody: <1 [IU]/mL (ref 0.0–0.9)
Thyroperoxidase Ab SerPl-aCnc: <9 [IU]/mL (ref 0–34)
Vit D, 25-Hydroxy: 44.6 ng/mL (ref 30.0–100.0)

## 2023-09-21 ENCOUNTER — Encounter: Payer: Self-pay | Admitting: Nurse Practitioner

## 2024-01-05 ENCOUNTER — Ambulatory Visit: Payer: BC Managed Care – PPO | Admitting: Nurse Practitioner

## 2024-01-11 ENCOUNTER — Ambulatory Visit: Admitting: Nurse Practitioner

## 2024-01-12 ENCOUNTER — Ambulatory Visit: Admitting: Nurse Practitioner

## 2024-05-29 ENCOUNTER — Ambulatory Visit (LOCAL_COMMUNITY_HEALTH_CENTER): Payer: Self-pay

## 2024-05-29 DIAGNOSIS — Z111 Encounter for screening for respiratory tuberculosis: Secondary | ICD-10-CM

## 2024-05-31 ENCOUNTER — Ambulatory Visit (LOCAL_COMMUNITY_HEALTH_CENTER): Payer: Self-pay

## 2024-05-31 DIAGNOSIS — Z111 Encounter for screening for respiratory tuberculosis: Secondary | ICD-10-CM

## 2024-05-31 LAB — TB SKIN TEST
Induration: 0 mm
TB Skin Test: NEGATIVE

## 2024-06-01 ENCOUNTER — Other Ambulatory Visit: Payer: Self-pay
